# Patient Record
Sex: Female | Born: 1955
Health system: Southern US, Community
[De-identification: ages and names within clinical notes are randomized; demographics above are authoritative.]

## PROBLEM LIST (undated history)

## (undated) DIAGNOSIS — R319 Hematuria, unspecified: Secondary | ICD-10-CM

## (undated) DIAGNOSIS — N841 Polyp of cervix uteri: Secondary | ICD-10-CM

## (undated) DIAGNOSIS — E7800 Pure hypercholesterolemia, unspecified: Secondary | ICD-10-CM

## (undated) DIAGNOSIS — A6009 Herpesviral infection of other urogenital tract: Secondary | ICD-10-CM

## (undated) HISTORY — PX: BACK SURGERY: SHX140

## (undated) HISTORY — PX: LIPOSUCTION TRUNK: SUR833

## (undated) HISTORY — PX: TUBAL LIGATION: SHX77

## (undated) HISTORY — DX: Hematuria, unspecified: R31.9

## (undated) HISTORY — DX: Pure hypercholesterolemia, unspecified: E78.00

## (undated) HISTORY — DX: Polyp of cervix uteri: N84.1

## (undated) HISTORY — DX: Herpesviral infection of other urogenital tract: A60.09

## (undated) HISTORY — PX: CHOLECYSTECTOMY: SHX55

---

## 2001-05-07 ENCOUNTER — Encounter: Payer: Self-pay | Admitting: Internal Medicine

## 2001-05-07 ENCOUNTER — Ambulatory Visit (HOSPITAL_COMMUNITY): Admission: RE | Admit: 2001-05-07 | Discharge: 2001-05-07 | Payer: Self-pay | Admitting: Internal Medicine

## 2001-10-20 ENCOUNTER — Other Ambulatory Visit: Admission: RE | Admit: 2001-10-20 | Discharge: 2001-10-20 | Payer: Self-pay | Admitting: Obstetrics and Gynecology

## 2002-05-11 ENCOUNTER — Encounter: Payer: Self-pay | Admitting: Obstetrics and Gynecology

## 2002-05-11 ENCOUNTER — Ambulatory Visit (HOSPITAL_COMMUNITY): Admission: RE | Admit: 2002-05-11 | Discharge: 2002-05-11 | Payer: Self-pay | Admitting: Obstetrics and Gynecology

## 2002-08-27 ENCOUNTER — Encounter: Payer: Self-pay | Admitting: Podiatry

## 2002-08-30 ENCOUNTER — Ambulatory Visit (HOSPITAL_COMMUNITY): Admission: RE | Admit: 2002-08-30 | Discharge: 2002-08-30 | Payer: Self-pay | Admitting: Podiatry

## 2002-11-01 ENCOUNTER — Other Ambulatory Visit: Admission: RE | Admit: 2002-11-01 | Discharge: 2002-11-01 | Payer: Self-pay | Admitting: Obstetrics and Gynecology

## 2003-05-26 ENCOUNTER — Ambulatory Visit (HOSPITAL_COMMUNITY): Admission: RE | Admit: 2003-05-26 | Discharge: 2003-05-26 | Payer: Self-pay | Admitting: Obstetrics and Gynecology

## 2004-05-01 ENCOUNTER — Ambulatory Visit (HOSPITAL_COMMUNITY): Admission: RE | Admit: 2004-05-01 | Discharge: 2004-05-01 | Payer: Self-pay | Admitting: Orthopedic Surgery

## 2004-05-03 ENCOUNTER — Encounter: Admission: RE | Admit: 2004-05-03 | Discharge: 2004-05-03 | Payer: Self-pay | Admitting: Neurosurgery

## 2004-05-17 ENCOUNTER — Ambulatory Visit (HOSPITAL_COMMUNITY): Admission: RE | Admit: 2004-05-17 | Discharge: 2004-05-18 | Payer: Self-pay | Admitting: Neurosurgery

## 2004-07-09 ENCOUNTER — Ambulatory Visit (HOSPITAL_COMMUNITY): Admission: RE | Admit: 2004-07-09 | Discharge: 2004-07-09 | Payer: Self-pay | Admitting: Obstetrics and Gynecology

## 2004-07-19 ENCOUNTER — Encounter (HOSPITAL_COMMUNITY): Admission: RE | Admit: 2004-07-19 | Discharge: 2004-08-18 | Payer: Self-pay | Admitting: Neurosurgery

## 2005-09-30 ENCOUNTER — Ambulatory Visit (HOSPITAL_COMMUNITY): Admission: RE | Admit: 2005-09-30 | Discharge: 2005-09-30 | Payer: Self-pay | Admitting: Obstetrics and Gynecology

## 2006-01-28 ENCOUNTER — Ambulatory Visit (HOSPITAL_COMMUNITY): Admission: RE | Admit: 2006-01-28 | Discharge: 2006-01-28 | Payer: Self-pay | Admitting: General Surgery

## 2006-09-29 ENCOUNTER — Ambulatory Visit: Payer: Self-pay | Admitting: Orthopedic Surgery

## 2007-05-11 ENCOUNTER — Ambulatory Visit (HOSPITAL_COMMUNITY): Admission: RE | Admit: 2007-05-11 | Discharge: 2007-05-11 | Payer: Self-pay | Admitting: Obstetrics and Gynecology

## 2007-06-16 ENCOUNTER — Emergency Department (HOSPITAL_COMMUNITY): Admission: EM | Admit: 2007-06-16 | Discharge: 2007-06-16 | Payer: Self-pay | Admitting: Emergency Medicine

## 2008-03-23 ENCOUNTER — Other Ambulatory Visit: Admission: RE | Admit: 2008-03-23 | Discharge: 2008-03-23 | Payer: Self-pay | Admitting: Obstetrics and Gynecology

## 2008-05-18 ENCOUNTER — Ambulatory Visit (HOSPITAL_COMMUNITY): Admission: RE | Admit: 2008-05-18 | Discharge: 2008-05-18 | Payer: Self-pay | Admitting: Obstetrics and Gynecology

## 2009-03-28 ENCOUNTER — Other Ambulatory Visit: Admission: RE | Admit: 2009-03-28 | Discharge: 2009-03-28 | Payer: Self-pay | Admitting: Obstetrics and Gynecology

## 2009-05-24 ENCOUNTER — Ambulatory Visit (HOSPITAL_COMMUNITY): Admission: RE | Admit: 2009-05-24 | Discharge: 2009-05-24 | Payer: Self-pay | Admitting: Family Medicine

## 2010-05-25 ENCOUNTER — Ambulatory Visit (HOSPITAL_COMMUNITY): Admission: RE | Admit: 2010-05-25 | Discharge: 2010-05-25 | Payer: Self-pay | Admitting: Obstetrics and Gynecology

## 2010-05-28 ENCOUNTER — Other Ambulatory Visit: Admission: RE | Admit: 2010-05-28 | Discharge: 2010-05-28 | Payer: Self-pay | Admitting: Obstetrics and Gynecology

## 2010-08-18 ENCOUNTER — Encounter: Payer: Self-pay | Admitting: Orthopedic Surgery

## 2010-12-14 NOTE — H&P (Signed)
   NAME:  Dawn Gregory, ADOLF                          ACCOUNT NO.:  [id number]   MEDICAL RECORD NO.:  [id number]                   PATIENT TYPE:  AMB   LOCATION:  DAY                                  FACILITY:  [facility]   PHYSICIAN:  Oley Balm. Pricilla Holm, D.P.M.             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  The patient relates that she has problems  wearing enclosed shoes and difficulty ambulating.  Injection therapy and  anti-inflammatory medication has been attempted without relief of  symptomatology.  The patient requests surgical correction of same.   PAST MEDICAL HISTORY:  1. Cholecystectomy.  2. Tubal ligation.  3. No transfusions or hepatitis.   MEDICATIONS:  1. Lipitor.  2. Iron.   ALLERGIES:  HYDROCODONE.   REVIEW OF SYSTEMS:  Uneventful.   PHYSICAL EXAMINATION:  EXTREMITIES:  Lower extremity reveals palpable pedal  pulses of DP and PT with spontaneous capillary drawn time.  NEUROLOGIC:  Within normal limits.  MUSCULOSKELETAL:  Reveals pain to palpation of the medial limits over the  right first metatarsal, both medially and dorsally.  There is also  limitation of motion of the first MTP of the right foot.   LABORATORY DATA:  X-rays reveal square metatarsal head with noted dorsal  hypertrophy and dorsal spurring that is consistent with hallux limitus.   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    PLAN:  I reviewed both conservative and surgical management.  The patient  elected to have the problem surgically corrected.  I have reviewed with her  decompression osteotomy of the first MTP.  I have reviewed the procedure  with the patient including complications of the procedure, such as  infection,  bone infection, postoperative pain, swelling, etc.  The patient  seems to understand the same and surgery has been scheduled for August 30, 2002.                                                     Oley Balm Pricilla Holm, D.P.M.    DBT/MEDQ  D:  08/29/2002  T:  08/29/2002  Job:  [id number]

## 2010-12-14 NOTE — Op Note (Signed)
Dawn Gregory, Dawn Gregory NO.:  [id number]   MEDICAL RECORD NO.:  [id number]          PATIENT TYPE:  OIB   LOCATION:  2899                         FACILITY:  [facility]   PHYSICIAN:  Clydene Fake, M.D.  DATE OF BIRTH:  Oct 22, 1955   DATE OF PROCEDURE:  05/17/2004  DATE OF DISCHARGE:                                 OPERATIVE REPORT   PREOPERATIVELY DIAGNOSIS:  Herniated nucleus pulposus, left L4-5.   POSTOPERATIVE DIAGNOSIS:  Herniated nucleus pulposus, left L4-5.   PROCEDURES:  Left L4-5 semihemilaminectomy and diskectomy, microdissection  with the microscope.   SURGEON:  Clydene Fake, M.D.   ASSISTANT:  Dr. Lovell Sheehan.   ANESTHESIA:  General endotracheal tube.   ESTIMATED BLOOD LOSS:  Minimal.   BLOOD GIVEN:  None.   DRAINS:  None.   COMPLICATIONS:  None.   REASON FOR PROCEDURE:  The patient is a 55 year old woman with back and left  leg pain that has continued despite prednisone and epidural injection.  MRI  was done showing large central disk protrusion extending cephalad from the  L4-5 level.  The patient was brought in for decompression and diskectomy.   PROCEDURE IN DETAIL:  The patient was brought into the operating room.  General anesthesia was induced.  The patient was placed in the prone  position on the Wilson frame with all pressure points padded.  The patient  was prepped and draped in the usual sterile fashion.  The incision was  injected with 10 mL of 1% lidocaine with epinephrine.  The needle was placed  in the interspace.  X-rays were obtained showing the point at the 3-4  interspace.  The incision was then made centered below.  Replaced the  needle.  Incision taken down to the fascia.  Hemostasis obtained with Bovie  cauterization.  The fascia was incised and subperiosteal dissection to the  L4-5 spinous process and lamina out to the facet.  A protector was placed.  A marker was placed in the interspace.  Another x-ray was obtained  confirming we were at the 4-5 interspace.  The microscope was brought in for  microdissection.  A high-speed drill was used to start a semihemilainectomy  and medial facetectomy.  This was completed with Kerrison punches.  The  ligamentum flavum was removed and foraminotomy was done over the 5 root.  We  explored the disk space and used a root retractor.  We used a hook and found  a free fragment of disk.  We loosened that up and removed a very long free  fragment of disk that was extending cephalad.  This still contained a disk  bulge causing semicompression there and the disk space was incised with a 15  blade and diskectomy performed with pituitary rongeurs and curets.  When it  appeared we had good central and lateral decompression of the central canal  of the 5 and 4 roots, we went up under the dura with a hook looking for any  other free fragments and could not find any.  It was felt we had a good  decompression.  The  wound was irrigated with antibiotic solution.  Hemostasis was obtained with cauterization, Gelfoam and thrombin.  Gelfoam  was irrigated out.  The retractor was removed.  The fascia was closed with 0  Vicryl interrupted suture.  The subcutaneous tissues were closed with 2-0  Vicryl and 3-0 Vicryl interrupted sutures.  The skin was closed with Benzoin  and Steri-Strips.  A dressing was placed.  The patient was placed back into  the supine position, awoke from anesthesia and transferred to the recovery  room in stable condition.       JRH/MEDQ  D:  05/17/2004  T:  05/17/2004  Job:  [id number]

## 2010-12-14 NOTE — Op Note (Signed)
NAME:  Dawn Gregory, Dawn Gregory                          ACCOUNT NO.:  [id number]   MEDICAL RECORD NO.:  [id number]                   PATIENT TYPE:  AMB   LOCATION:  DAY                                  FACILITY:  [facility]   PHYSICIAN:  Oley Balm. Pricilla Holm, D.P.M.             DATE OF BIRTH:  [birth year]   DATE OF PROCEDURE:  08/30/2002  DATE OF DISCHARGE:                                 OPERATIVE REPORT   SURGEON OF RECORD:  Oley Balm. Pricilla Holm, D.P.M.   ANESTHESIA:  Local standby.   PREOPERATIVE DIAGNOSIS:  Hallux limitus rigidus, right foot.   POSTOPERATIVE DIAGNOSIS:  Hallux limitus rigidus, right foot.   PROCEDURE:  Repair of hallux limitus deformity by Eliberto Ivory osteotomy and  shortening osteotomy first metatarsal, right foot.   INDICATIONS FOR SURGERY:  Longstanding history of pain, unrelieved by  conservative care.   DESCRIPTION OF PROCEDURE:  The patient brought in the operating room and  placed on the operating table in the supine position.  The patient's lower  right foot and leg was then prepped and draped in the usual aseptic manner.  Then with ankle tourniquet in place and well-padded to prevent contusion,  elevated 250 mmHg.  After exsanguination of the right foot, the following  surgical procedures were then performed under monitored anesthesia care with  local infiltrate of 2% Xylocaine and 0.25% Marcaine.   AUSTIN OSTEOTOMY FIRST METATARSAL AND SHORTENING OSTEOTOMY FOR REPAIR OF  HALLUS LIMITUS RIGIDUS DEFORMITY:  Attention directed to the dorsomedial  aspect of the first metatarsal where a linear incision made and the incision  widened and deepened via sharp and blunt dissection to be sure to identify  and protect all vital structures.  A capsular incision was then made in the  head of the metatarsal, freeing the transcutaneous soft tissue attachments  dorsally and medially.  It should be noted that osteophyte formation was on  the dorsal aspect of the joint on the proximal phalanx  and laterally and  medially.  All osteophytes were resected.  The medial aspect resected and  then an Austin-type osteotomy was made from medial to lateral.  A 2 mm wedge  of bone was then removed to shorten the metatarsal.  The metatarsal was then  compacted.  It should be noted that the metatarsal was plantar-flexed at the  same time.  The wound was lavaged with copious amounts of sterile saline and  all rough edges rasped smooth, the remaining protruding aspect of the first  metatarsal resected, and the head was then fixated with two .045 K-wires.  After fixation, it was noted that the osteotomy site was stable, all rough  edges again were rasped smooth, the wound lavaged with copious amounts of  sterile saline and the capsule and subcutaneous tissues approximated with  tensioned suture of 4-0 Dexon, and the skin was approximated utilizing  running subcuticular suture of 4-0 Dexon.  All surgical sites  were then infiltrated with approximately 18 mL of  Dexamethasone and phosphate and mild compressive bandages consisting of  Betadine-soaked Adaptic, sterile 4 x 4's, sterile cream were applied.  The  patient tolerated the procedure well and left the operating room in apparent  good condition to recovery room.                                               Oley Balm Pricilla Holm, D.P.M.    DBT/MEDQ  D:  08/30/2002  T:  08/30/2002  Job:  [id number]

## 2010-12-14 NOTE — H&P (Signed)
NAME:  Dawn Gregory, KRAGE                ACCOUNT NO.:  [id number]   MEDICAL RECORD NO.:  [id number]          PATIENT TYPE:  AMB   LOCATION:                                FACILITY:  [facility]   PHYSICIAN:  Dalia Heading, M.D.  DATE OF BIRTH:  June 26, 1956   DATE OF ADMISSION:  DATE OF DISCHARGE:  LH                                HISTORY & PHYSICAL   CHIEF COMPLAINT:  Need for screening colonoscopy.   HISTORY OF PRESENT ILLNESS:  The patient is a 55 year old white female who  presents for screening colonoscopy.  She denies any gastrointestinal  complaints.  There is no family history of colon carcinoma.   PAST MEDICAL HISTORY:  Unremarkable.   PAST SURGICAL HISTORY:  1.  Tubal ligation.  2.  Laparoscopic cholecystectomy.  3.  Herniated disk.   MEDICATIONS:  Vytorin.   ALLERGIES:  HYDROCODONE.   REVIEW OF SYSTEMS:  Noncontributory.   PHYSICAL EXAMINATION:  GENERAL:  The patient is a well-developed, well-  nourished white female in no acute distress.  LUNGS:  Clear to auscultation with equal breath sounds bilaterally.  HEART EXAMINATION:  Reveals A regular rate and rhythm without S3-S4 or  murmurs.  ABDOMEN:  Soft, nontender, nondistended.  No hepatosplenomegaly or masses  are noted.  RECTAL EXAMINATION:  Deferred to the procedure.   IMPRESSION:  Need for screening colonoscopy.   PLAN:  The patient is scheduled for colonoscopy on 01/28/2006.  The risks  and benefits of the procedure including bleeding and perforation were fully  explained to the patient, who gave informed consent.     Dalia Heading, M.D.  Electronically Signed    MAJ/MEDQ  D:  01/27/2006  T:  01/27/2006  Job:  [id number]

## 2011-05-02 ENCOUNTER — Other Ambulatory Visit: Payer: Self-pay | Admitting: Obstetrics & Gynecology

## 2011-05-02 DIAGNOSIS — Z139 Encounter for screening, unspecified: Secondary | ICD-10-CM

## 2011-05-27 ENCOUNTER — Ambulatory Visit (HOSPITAL_COMMUNITY)
Admission: RE | Admit: 2011-05-27 | Discharge: 2011-05-27 | Disposition: A | Discharge status: Home / Self Care | Payer: 59 | Source: Ambulatory Visit | Attending: Obstetrics & Gynecology | Admitting: Obstetrics & Gynecology

## 2011-05-27 DIAGNOSIS — Z139 Encounter for screening, unspecified: Secondary | ICD-10-CM

## 2011-05-27 DIAGNOSIS — Z1231 Encounter for screening mammogram for malignant neoplasm of breast: Secondary | ICD-10-CM | POA: Insufficient documentation

## 2011-06-11 ENCOUNTER — Other Ambulatory Visit: Payer: Self-pay | Admitting: Obstetrics & Gynecology

## 2011-06-11 ENCOUNTER — Other Ambulatory Visit (HOSPITAL_COMMUNITY)
Admission: RE | Admit: 2011-06-11 | Discharge: 2011-06-11 | Disposition: A | Discharge status: Home / Self Care | Payer: 59 | Source: Ambulatory Visit | Attending: Obstetrics & Gynecology | Admitting: Obstetrics & Gynecology

## 2011-06-11 DIAGNOSIS — Z01419 Encounter for gynecological examination (general) (routine) without abnormal findings: Secondary | ICD-10-CM | POA: Insufficient documentation

## 2012-05-01 ENCOUNTER — Other Ambulatory Visit: Payer: Self-pay | Admitting: Obstetrics & Gynecology

## 2012-05-01 DIAGNOSIS — Z139 Encounter for screening, unspecified: Secondary | ICD-10-CM

## 2012-06-01 ENCOUNTER — Ambulatory Visit (HOSPITAL_COMMUNITY)
Admission: RE | Admit: 2012-06-01 | Discharge: 2012-06-01 | Disposition: A | Discharge status: Home / Self Care | Payer: 59 | Source: Ambulatory Visit | Attending: Obstetrics & Gynecology | Admitting: Obstetrics & Gynecology

## 2012-06-01 DIAGNOSIS — Z1231 Encounter for screening mammogram for malignant neoplasm of breast: Secondary | ICD-10-CM | POA: Insufficient documentation

## 2012-06-01 DIAGNOSIS — Z139 Encounter for screening, unspecified: Secondary | ICD-10-CM

## 2012-07-11 ENCOUNTER — Encounter: Payer: Self-pay | Admitting: Advanced Practice Midwife

## 2012-12-28 ENCOUNTER — Other Ambulatory Visit: Payer: Self-pay | Admitting: Obstetrics & Gynecology

## 2013-05-03 ENCOUNTER — Other Ambulatory Visit: Payer: Self-pay | Admitting: Obstetrics & Gynecology

## 2013-10-27 ENCOUNTER — Other Ambulatory Visit: Payer: Self-pay | Admitting: Obstetrics & Gynecology

## 2013-10-27 MED ORDER — ALPRAZOLAM 0.5 MG PO TABS: 0.5000 mg | ORAL_TABLET | Freq: Three times a day (TID) | ORAL | Status: DC | PRN

## 2013-11-23 ENCOUNTER — Ambulatory Visit (HOSPITAL_COMMUNITY)
Admission: RE | Admit: 2013-11-23 | Discharge: 2013-11-23 | Disposition: A | Discharge status: Home / Self Care | Payer: 59 | Source: Ambulatory Visit | Attending: Obstetrics & Gynecology | Admitting: Obstetrics & Gynecology

## 2013-11-23 ENCOUNTER — Other Ambulatory Visit: Payer: Self-pay | Admitting: Obstetrics & Gynecology

## 2013-11-23 DIAGNOSIS — Z1231 Encounter for screening mammogram for malignant neoplasm of breast: Secondary | ICD-10-CM | POA: Insufficient documentation

## 2013-11-23 DIAGNOSIS — Z139 Encounter for screening, unspecified: Secondary | ICD-10-CM

## 2014-04-08 ENCOUNTER — Other Ambulatory Visit: Payer: Self-pay | Admitting: Obstetrics & Gynecology

## 2014-04-13 ENCOUNTER — Other Ambulatory Visit: Payer: Self-pay | Admitting: Obstetrics & Gynecology

## 2014-04-13 MED ORDER — ALPRAZOLAM 0.5 MG PO TABS: 0.5000 mg | ORAL_TABLET | Freq: Three times a day (TID) | ORAL | Status: AC | PRN

## 2014-04-13 MED ORDER — ALPRAZOLAM 0.5 MG PO TABS: 0.5000 mg | ORAL_TABLET | Freq: Three times a day (TID) | ORAL | Status: DC | PRN

## 2014-05-05 ENCOUNTER — Other Ambulatory Visit: Payer: Self-pay | Admitting: Obstetrics & Gynecology

## 2014-05-30 ENCOUNTER — Encounter: Payer: Self-pay | Admitting: Advanced Practice Midwife

## 2015-03-16 ENCOUNTER — Other Ambulatory Visit: Payer: Self-pay | Admitting: Obstetrics & Gynecology

## 2015-03-16 ENCOUNTER — Ambulatory Visit (HOSPITAL_COMMUNITY)
Admission: RE | Admit: 2015-03-16 | Discharge: 2015-03-16 | Disposition: A | Discharge status: Home / Self Care | Payer: 59 | Source: Ambulatory Visit | Attending: Obstetrics & Gynecology | Admitting: Obstetrics & Gynecology

## 2015-03-16 DIAGNOSIS — Z1231 Encounter for screening mammogram for malignant neoplasm of breast: Secondary | ICD-10-CM | POA: Diagnosis not present

## 2015-05-22 ENCOUNTER — Other Ambulatory Visit: Payer: Self-pay | Admitting: Obstetrics & Gynecology

## 2015-08-21 MED FILL — SIMVASTATIN 20 MG TABLET: 20 | 90 days supply | Qty: 90 | Fill #1

## 2015-10-03 DIAGNOSIS — E785 Hyperlipidemia, unspecified: Secondary | ICD-10-CM | POA: Diagnosis not present

## 2015-10-03 DIAGNOSIS — Z0000 Encounter for general adult medical examination without abnormal findings: Secondary | ICD-10-CM | POA: Diagnosis not present

## 2015-10-06 DIAGNOSIS — Z6834 Body mass index (BMI) 34.0-34.9, adult: Secondary | ICD-10-CM | POA: Diagnosis not present

## 2015-10-06 DIAGNOSIS — E782 Mixed hyperlipidemia: Secondary | ICD-10-CM | POA: Diagnosis not present

## 2015-10-06 DIAGNOSIS — E6609 Other obesity due to excess calories: Secondary | ICD-10-CM | POA: Diagnosis not present

## 2015-10-06 DIAGNOSIS — F411 Generalized anxiety disorder: Secondary | ICD-10-CM | POA: Diagnosis not present

## 2015-10-09 MED FILL — ALPRAZolam 0.5 MG TABS: 0.5 | 20 days supply | Qty: 60 | Fill #0

## 2015-10-09 MED FILL — PHENTERMINE 15 MG CAPSULE: 15 | 30 days supply | Qty: 30 | Fill #0

## 2015-11-01 DIAGNOSIS — Z6834 Body mass index (BMI) 34.0-34.9, adult: Secondary | ICD-10-CM | POA: Diagnosis not present

## 2015-11-01 DIAGNOSIS — E6609 Other obesity due to excess calories: Secondary | ICD-10-CM | POA: Diagnosis not present

## 2015-11-03 MED FILL — PHENTERMINE 30 MG CAPSULE: 30 | 30 days supply | Qty: 30 | Fill #0

## 2015-11-20 MED FILL — SIMVASTATIN 20 MG TABLET: 20 | 90 days supply | Qty: 90 | Fill #2

## 2015-12-06 DIAGNOSIS — E782 Mixed hyperlipidemia: Secondary | ICD-10-CM | POA: Diagnosis not present

## 2015-12-06 MED FILL — PHENTERMINE 37.5 MG TABLET: 37.5 | 30 days supply | Qty: 30 | Fill #0

## 2016-01-05 MED FILL — PHENTERMINE 37.5 MG TABLET: 37.5 | 30 days supply | Qty: 30 | Fill #1

## 2016-02-06 MED FILL — PHENTERMINE 37.5 MG TABLET: 37.5 | 30 days supply | Qty: 30 | Fill #2

## 2016-02-19 MED FILL — SIMVASTATIN 20 MG TABLET: 20 | 90 days supply | Qty: 90 | Fill #3

## 2016-03-12 DIAGNOSIS — E782 Mixed hyperlipidemia: Secondary | ICD-10-CM | POA: Diagnosis not present

## 2016-03-12 DIAGNOSIS — E6609 Other obesity due to excess calories: Secondary | ICD-10-CM | POA: Diagnosis not present

## 2016-03-13 MED FILL — ALPRAZolam 0.5 MG TABS: 0.5 | 20 days supply | Qty: 60 | Fill #0

## 2016-03-13 MED FILL — PHENTERMINE 37.5 MG TABLET: 37.5 | 30 days supply | Qty: 30 | Fill #0

## 2016-05-06 MED FILL — PHENTERMINE 37.5 MG TABLET: 37.5 | 30 days supply | Qty: 30 | Fill #1

## 2016-05-10 ENCOUNTER — Other Ambulatory Visit (HOSPITAL_COMMUNITY): Payer: Self-pay | Admitting: Adult Health Nurse Practitioner

## 2016-05-10 ENCOUNTER — Ambulatory Visit (HOSPITAL_COMMUNITY)
Admission: RE | Admit: 2016-05-10 | Discharge: 2016-05-10 | Disposition: A | Discharge status: Home / Self Care | Payer: 59 | Source: Ambulatory Visit | Attending: Adult Health Nurse Practitioner | Admitting: Adult Health Nurse Practitioner

## 2016-05-10 DIAGNOSIS — Z1231 Encounter for screening mammogram for malignant neoplasm of breast: Secondary | ICD-10-CM | POA: Insufficient documentation

## 2016-05-14 MED FILL — SIMVASTATIN 20 MG TABLET: 20 | 90 days supply | Qty: 90 | Fill #0

## 2016-07-12 MED FILL — PHENTERMINE 37.5 MG TABLET: 37.5 | 30 days supply | Qty: 30 | Fill #2

## 2016-08-19 MED FILL — SIMVASTATIN 20 MG TABLET: 20 | 90 days supply | Qty: 90 | Fill #1

## 2016-10-25 DIAGNOSIS — H43813 Vitreous degeneration, bilateral: Secondary | ICD-10-CM | POA: Diagnosis not present

## 2016-10-25 DIAGNOSIS — H52221 Regular astigmatism, right eye: Secondary | ICD-10-CM | POA: Diagnosis not present

## 2016-10-25 DIAGNOSIS — H5213 Myopia, bilateral: Secondary | ICD-10-CM | POA: Diagnosis not present

## 2016-10-25 DIAGNOSIS — H524 Presbyopia: Secondary | ICD-10-CM | POA: Diagnosis not present

## 2016-11-12 DIAGNOSIS — E782 Mixed hyperlipidemia: Secondary | ICD-10-CM | POA: Diagnosis not present

## 2016-11-15 DIAGNOSIS — E782 Mixed hyperlipidemia: Secondary | ICD-10-CM | POA: Diagnosis not present

## 2016-11-15 DIAGNOSIS — Z6831 Body mass index (BMI) 31.0-31.9, adult: Secondary | ICD-10-CM | POA: Diagnosis not present

## 2016-11-15 DIAGNOSIS — E6609 Other obesity due to excess calories: Secondary | ICD-10-CM | POA: Diagnosis not present

## 2016-11-15 DIAGNOSIS — Z0000 Encounter for general adult medical examination without abnormal findings: Secondary | ICD-10-CM | POA: Diagnosis not present

## 2016-11-15 DIAGNOSIS — F411 Generalized anxiety disorder: Secondary | ICD-10-CM | POA: Diagnosis not present

## 2016-11-15 MED FILL — SIMVASTATIN 20 MG TABLET: 20 | 90 days supply | Qty: 90 | Fill #0

## 2016-11-18 MED FILL — PHENTERMINE 37.5 MG TABLET: 37.5 | 30 days supply | Qty: 30 | Fill #0

## 2017-02-17 MED FILL — SIMVASTATIN 20 MG TABLET: 20 | 90 days supply | Qty: 90 | Fill #1

## 2017-05-22 ENCOUNTER — Ambulatory Visit (HOSPITAL_COMMUNITY)
Admission: RE | Admit: 2017-05-22 | Discharge: 2017-05-22 | Disposition: A | Discharge status: Home / Self Care | Payer: 59 | Source: Ambulatory Visit | Attending: Internal Medicine | Admitting: Internal Medicine

## 2017-05-22 ENCOUNTER — Other Ambulatory Visit (HOSPITAL_COMMUNITY): Payer: Self-pay | Admitting: Internal Medicine

## 2017-05-22 DIAGNOSIS — Z1231 Encounter for screening mammogram for malignant neoplasm of breast: Secondary | ICD-10-CM | POA: Diagnosis not present

## 2017-06-03 MED FILL — SIMVASTATIN 20 MG TABLET: 20 | 90 days supply | Qty: 90 | Fill #2

## 2017-08-18 DIAGNOSIS — J069 Acute upper respiratory infection, unspecified: Secondary | ICD-10-CM | POA: Diagnosis not present

## 2017-09-17 MED FILL — SIMVASTATIN 20 MG TABLET: 20 | 90 days supply | Qty: 90 | Fill #0

## 2017-11-13 DIAGNOSIS — E6609 Other obesity due to excess calories: Secondary | ICD-10-CM | POA: Diagnosis not present

## 2017-11-13 DIAGNOSIS — E782 Mixed hyperlipidemia: Secondary | ICD-10-CM | POA: Diagnosis not present

## 2017-11-13 DIAGNOSIS — J069 Acute upper respiratory infection, unspecified: Secondary | ICD-10-CM | POA: Diagnosis not present

## 2017-11-17 DIAGNOSIS — E782 Mixed hyperlipidemia: Secondary | ICD-10-CM | POA: Diagnosis not present

## 2017-11-17 DIAGNOSIS — Z01419 Encounter for gynecological examination (general) (routine) without abnormal findings: Secondary | ICD-10-CM | POA: Diagnosis not present

## 2017-11-17 DIAGNOSIS — J069 Acute upper respiratory infection, unspecified: Secondary | ICD-10-CM | POA: Diagnosis not present

## 2017-11-17 DIAGNOSIS — Z6835 Body mass index (BMI) 35.0-35.9, adult: Secondary | ICD-10-CM | POA: Diagnosis not present

## 2017-11-17 DIAGNOSIS — F419 Anxiety disorder, unspecified: Secondary | ICD-10-CM | POA: Diagnosis not present

## 2017-11-17 DIAGNOSIS — Z0001 Encounter for general adult medical examination with abnormal findings: Secondary | ICD-10-CM | POA: Diagnosis not present

## 2017-12-12 MED FILL — SIMVASTATIN 20 MG TABLET: 20 | 90 days supply | Qty: 90 | Fill #1

## 2018-03-16 MED FILL — SIMVASTATIN 20 MG TABLET: 20 | 90 days supply | Qty: 90 | Fill #2

## 2018-06-15 MED FILL — SIMVASTATIN 20 MG TABLET: 20 | 90 days supply | Qty: 90 | Fill #0

## 2018-06-29 ENCOUNTER — Other Ambulatory Visit (HOSPITAL_COMMUNITY): Payer: Self-pay | Admitting: Adult Health Nurse Practitioner

## 2018-06-29 DIAGNOSIS — Z1231 Encounter for screening mammogram for malignant neoplasm of breast: Secondary | ICD-10-CM

## 2018-07-03 ENCOUNTER — Ambulatory Visit (HOSPITAL_COMMUNITY)
Admission: RE | Admit: 2018-07-03 | Discharge: 2018-07-03 | Disposition: A | Discharge status: Home / Self Care | Payer: 59 | Source: Ambulatory Visit | Attending: Adult Health Nurse Practitioner | Admitting: Adult Health Nurse Practitioner

## 2018-07-03 DIAGNOSIS — Z1231 Encounter for screening mammogram for malignant neoplasm of breast: Secondary | ICD-10-CM | POA: Diagnosis not present

## 2018-09-15 MED FILL — SIMVASTATIN 20 MG TABLET: 20 | 90 days supply | Qty: 90 | Fill #1

## 2018-09-28 DIAGNOSIS — H5213 Myopia, bilateral: Secondary | ICD-10-CM | POA: Diagnosis not present

## 2018-09-28 DIAGNOSIS — H524 Presbyopia: Secondary | ICD-10-CM | POA: Diagnosis not present

## 2018-09-28 DIAGNOSIS — H43813 Vitreous degeneration, bilateral: Secondary | ICD-10-CM | POA: Diagnosis not present

## 2018-10-08 DIAGNOSIS — J069 Acute upper respiratory infection, unspecified: Secondary | ICD-10-CM | POA: Diagnosis not present

## 2018-11-11 DIAGNOSIS — E782 Mixed hyperlipidemia: Secondary | ICD-10-CM | POA: Diagnosis not present

## 2018-11-11 DIAGNOSIS — R7301 Impaired fasting glucose: Secondary | ICD-10-CM | POA: Diagnosis not present

## 2018-11-16 DIAGNOSIS — E785 Hyperlipidemia, unspecified: Secondary | ICD-10-CM | POA: Diagnosis not present

## 2018-11-16 DIAGNOSIS — R7301 Impaired fasting glucose: Secondary | ICD-10-CM | POA: Diagnosis not present

## 2018-11-16 DIAGNOSIS — Z0001 Encounter for general adult medical examination with abnormal findings: Secondary | ICD-10-CM | POA: Diagnosis not present

## 2018-11-16 DIAGNOSIS — F419 Anxiety disorder, unspecified: Secondary | ICD-10-CM | POA: Diagnosis not present

## 2018-11-16 MED FILL — ALPRAZolam 0.5 MG TABS: 0.5 | 30 days supply | Qty: 60 | Fill #0

## 2018-12-15 MED FILL — SIMVASTATIN 20 MG TABLET: 20 | 90 days supply | Qty: 90 | Fill #0

## 2019-03-15 MED FILL — SIMVASTATIN 20 MG TABLET: 20 | 90 days supply | Qty: 90 | Fill #1

## 2019-06-09 MED FILL — SIMVASTATIN 20 MG TABLET: 20 | 90 days supply | Qty: 90 | Fill #0

## 2019-06-11 ENCOUNTER — Other Ambulatory Visit (HOSPITAL_COMMUNITY): Payer: Self-pay | Admitting: Adult Health Nurse Practitioner

## 2019-06-11 DIAGNOSIS — Z1231 Encounter for screening mammogram for malignant neoplasm of breast: Secondary | ICD-10-CM

## 2019-07-07 ENCOUNTER — Ambulatory Visit (HOSPITAL_COMMUNITY)
Admission: RE | Admit: 2019-07-07 | Discharge: 2019-07-07 | Disposition: A | Discharge status: Home / Self Care | Payer: 59 | Source: Ambulatory Visit | Attending: Adult Health Nurse Practitioner | Admitting: Adult Health Nurse Practitioner

## 2019-07-07 ENCOUNTER — Other Ambulatory Visit: Payer: Self-pay

## 2019-07-07 DIAGNOSIS — Z1231 Encounter for screening mammogram for malignant neoplasm of breast: Secondary | ICD-10-CM | POA: Insufficient documentation

## 2019-07-28 DIAGNOSIS — J069 Acute upper respiratory infection, unspecified: Secondary | ICD-10-CM | POA: Diagnosis not present

## 2019-07-28 DIAGNOSIS — J029 Acute pharyngitis, unspecified: Secondary | ICD-10-CM | POA: Diagnosis not present

## 2019-09-14 MED FILL — SIMVASTATIN 20 MG TABLET: 20 | 90 days supply | Qty: 90 | Fill #1

## 2019-11-18 DIAGNOSIS — Z0001 Encounter for general adult medical examination with abnormal findings: Secondary | ICD-10-CM | POA: Diagnosis not present

## 2019-11-18 DIAGNOSIS — J029 Acute pharyngitis, unspecified: Secondary | ICD-10-CM | POA: Diagnosis not present

## 2019-11-18 DIAGNOSIS — R7301 Impaired fasting glucose: Secondary | ICD-10-CM | POA: Diagnosis not present

## 2019-11-18 DIAGNOSIS — E782 Mixed hyperlipidemia: Secondary | ICD-10-CM | POA: Diagnosis not present

## 2019-11-18 DIAGNOSIS — Z6835 Body mass index (BMI) 35.0-35.9, adult: Secondary | ICD-10-CM | POA: Diagnosis not present

## 2019-11-18 DIAGNOSIS — J069 Acute upper respiratory infection, unspecified: Secondary | ICD-10-CM | POA: Diagnosis not present

## 2019-11-18 DIAGNOSIS — E785 Hyperlipidemia, unspecified: Secondary | ICD-10-CM | POA: Diagnosis not present

## 2019-11-18 DIAGNOSIS — F419 Anxiety disorder, unspecified: Secondary | ICD-10-CM | POA: Diagnosis not present

## 2019-11-23 DIAGNOSIS — F419 Anxiety disorder, unspecified: Secondary | ICD-10-CM | POA: Diagnosis not present

## 2019-11-23 DIAGNOSIS — Z0001 Encounter for general adult medical examination with abnormal findings: Secondary | ICD-10-CM | POA: Diagnosis not present

## 2019-11-23 DIAGNOSIS — R7301 Impaired fasting glucose: Secondary | ICD-10-CM | POA: Diagnosis not present

## 2019-11-23 DIAGNOSIS — Z6836 Body mass index (BMI) 36.0-36.9, adult: Secondary | ICD-10-CM | POA: Diagnosis not present

## 2019-11-23 DIAGNOSIS — E782 Mixed hyperlipidemia: Secondary | ICD-10-CM | POA: Diagnosis not present

## 2019-11-23 DIAGNOSIS — E6609 Other obesity due to excess calories: Secondary | ICD-10-CM | POA: Diagnosis not present

## 2019-12-22 MED FILL — SIMVASTATIN 20 MG TABLET: 20 | 90 days supply | Qty: 90 | Fill #0

## 2020-08-04 ENCOUNTER — Other Ambulatory Visit (HOSPITAL_COMMUNITY): Payer: Self-pay | Admitting: Internal Medicine

## 2020-08-04 DIAGNOSIS — Z1231 Encounter for screening mammogram for malignant neoplasm of breast: Secondary | ICD-10-CM

## 2020-09-29 ENCOUNTER — Ambulatory Visit (HOSPITAL_COMMUNITY): Payer: 59

## 2020-10-06 ENCOUNTER — Ambulatory Visit (HOSPITAL_COMMUNITY)
Admission: RE | Admit: 2020-10-06 | Discharge: 2020-10-06 | Disposition: A | Discharge status: Home / Self Care | Payer: Medicare Other | Source: Ambulatory Visit | Attending: Internal Medicine | Admitting: Internal Medicine

## 2020-10-06 ENCOUNTER — Other Ambulatory Visit: Payer: Self-pay

## 2020-10-06 DIAGNOSIS — Z1231 Encounter for screening mammogram for malignant neoplasm of breast: Secondary | ICD-10-CM | POA: Diagnosis not present

## 2020-11-28 ENCOUNTER — Other Ambulatory Visit (HOSPITAL_COMMUNITY): Payer: Self-pay | Admitting: Internal Medicine

## 2020-11-28 DIAGNOSIS — Z13820 Encounter for screening for osteoporosis: Secondary | ICD-10-CM

## 2021-04-24 IMAGING — MG DIGITAL SCREENING BILAT W/ TOMO W/ CAD
8 series · 8 of 24 positions shown · non-contrast
Comparison: Previous exam(s).

CLINICAL DATA: Screening.

EXAM:
DIGITAL SCREENING BILATERAL MAMMOGRAM WITH TOMO AND CAD

[R CC synth-2D]
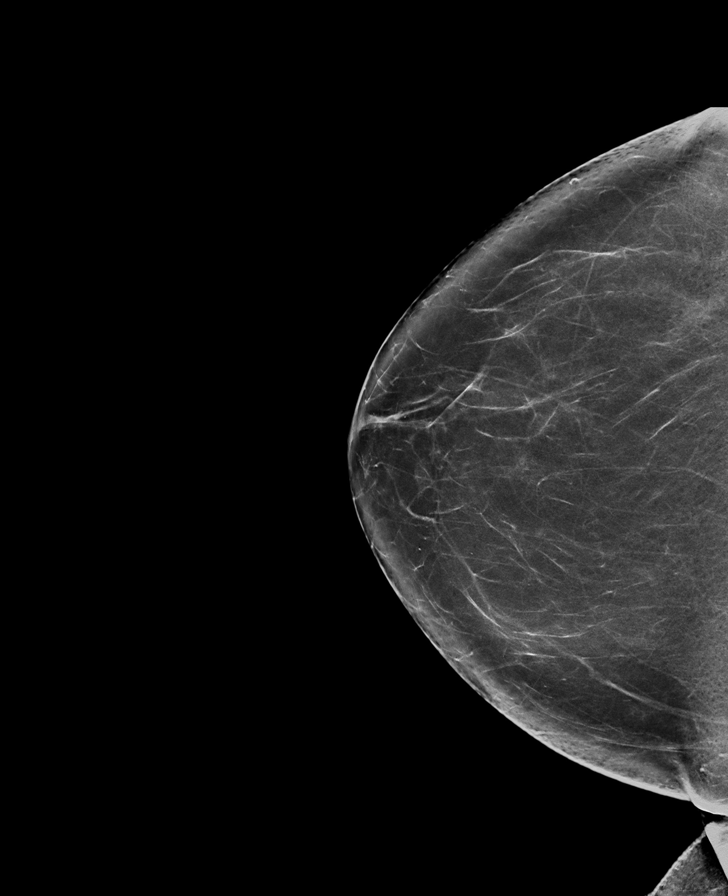

[L CC synth-2D]
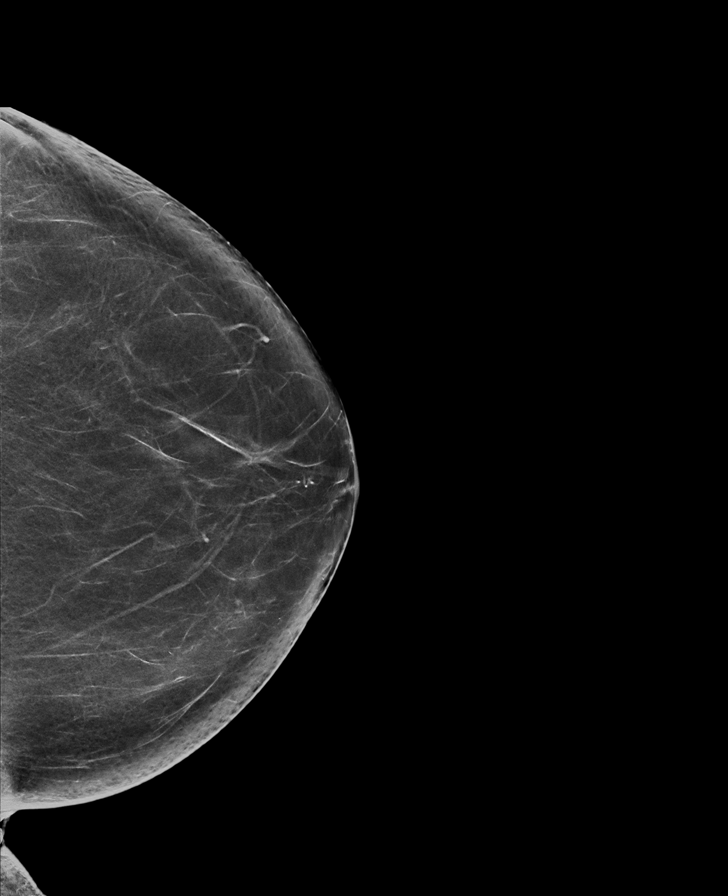

[R MLO synth-2D]
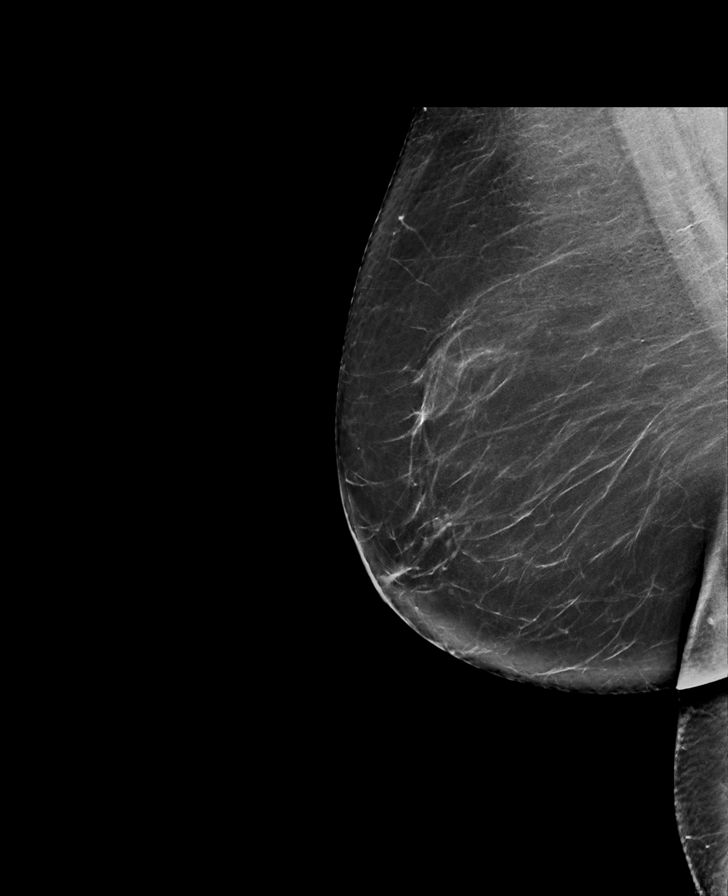

[L MLO synth-2D]
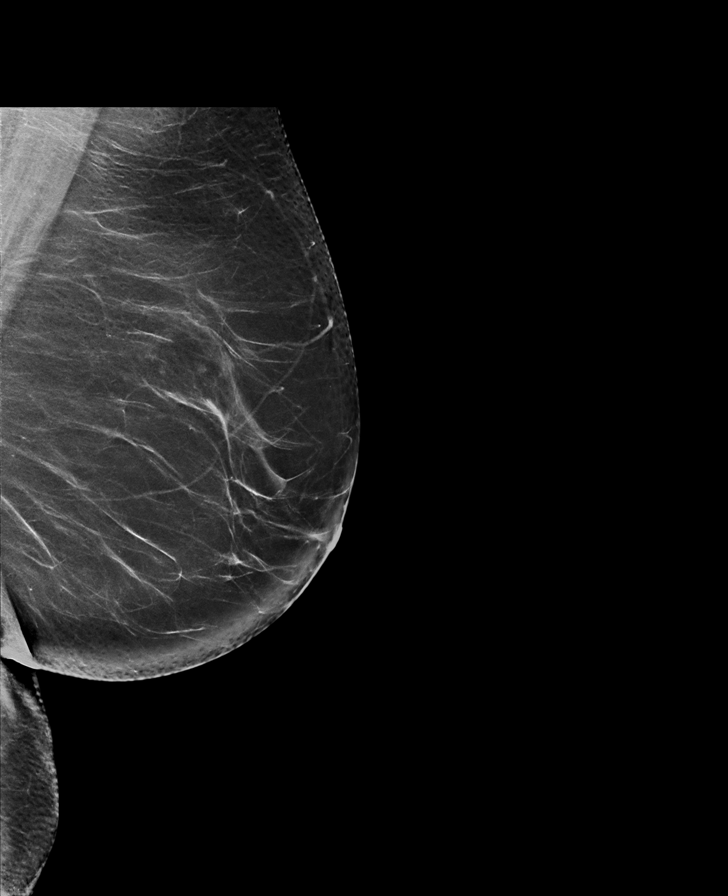

[L CC tomo · tomo slice 39/76.0]
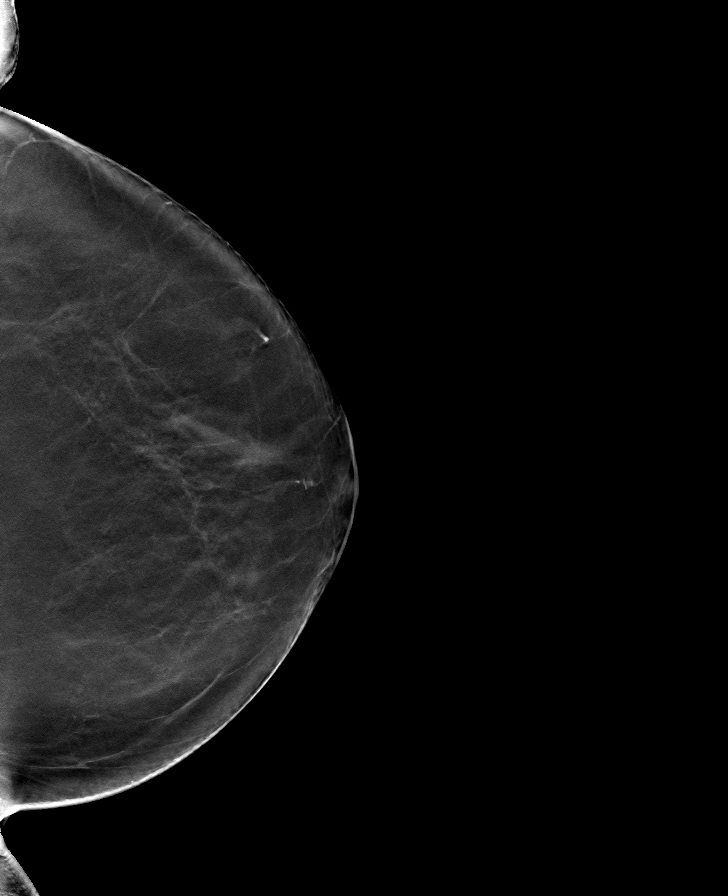

[R MLO tomo · tomo slice 43/84.0]
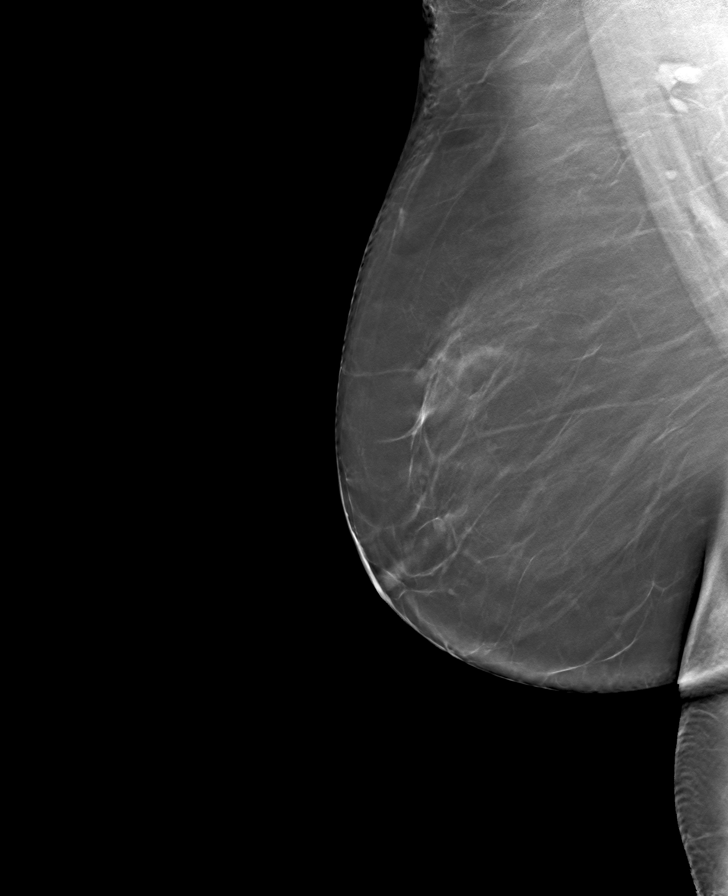

[L MLO tomo · tomo slice 41/82.0]
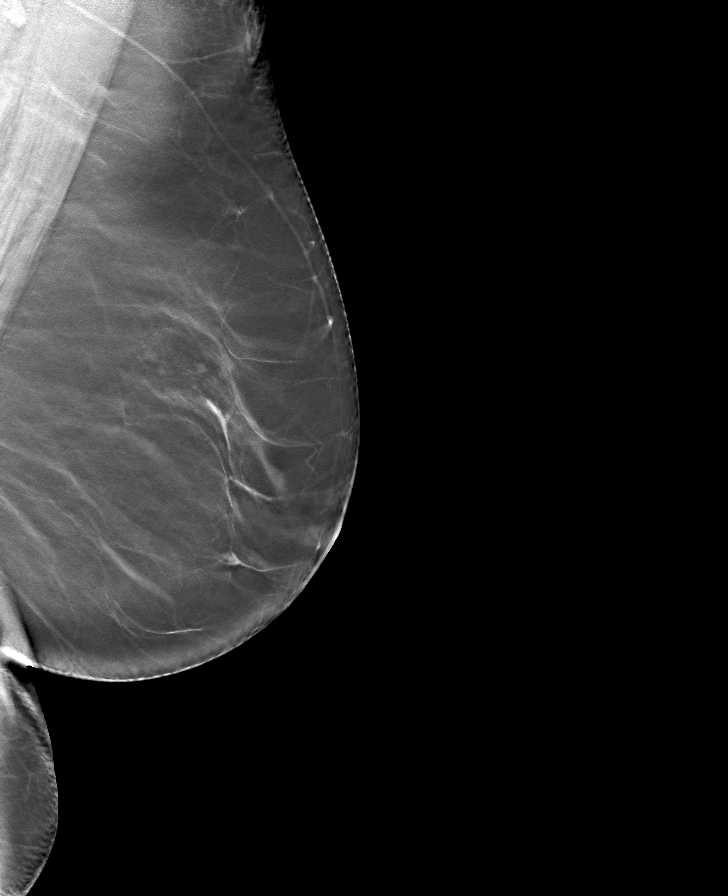

[R CC tomo · tomo slice 39/78.0]
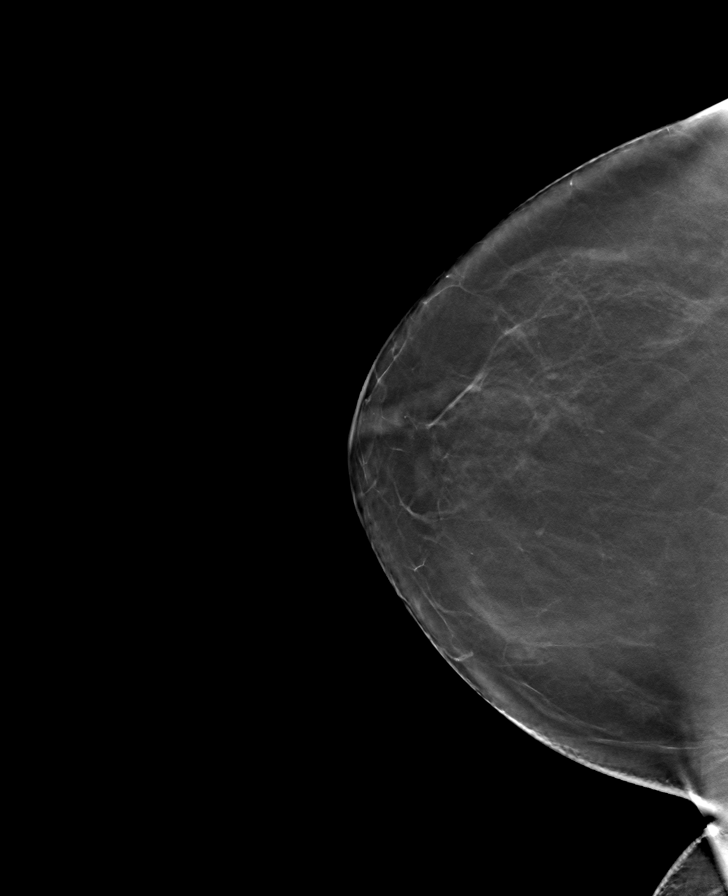

[8 of 24 positions shown; findings below may reference images not displayed]

ACR Breast Density Category b: There are scattered areas of
fibroglandular density.
FINDINGS: There are no findings suspicious for malignancy. Images were
processed with CAD.
IMPRESSION: No mammographic evidence of malignancy. A result letter of this
screening mammogram will be mailed directly to the patient.

RECOMMENDATION:
Screening mammogram in one year. (Code:CN-U-775)

BI-RADS CATEGORY  1: Negative.

## 2021-09-18 ENCOUNTER — Other Ambulatory Visit (HOSPITAL_COMMUNITY): Payer: Self-pay | Admitting: Internal Medicine

## 2021-09-18 DIAGNOSIS — Z1231 Encounter for screening mammogram for malignant neoplasm of breast: Secondary | ICD-10-CM

## 2021-10-10 ENCOUNTER — Ambulatory Visit (HOSPITAL_COMMUNITY)
Admission: RE | Admit: 2021-10-10 | Discharge: 2021-10-10 | Disposition: A | Discharge status: Home / Self Care | Payer: Medicare Other | Source: Ambulatory Visit | Attending: Internal Medicine | Admitting: Internal Medicine

## 2021-10-10 ENCOUNTER — Other Ambulatory Visit: Payer: Self-pay

## 2021-10-10 DIAGNOSIS — Z1231 Encounter for screening mammogram for malignant neoplasm of breast: Secondary | ICD-10-CM | POA: Insufficient documentation

## 2021-10-10 DIAGNOSIS — M810 Age-related osteoporosis without current pathological fracture: Secondary | ICD-10-CM | POA: Diagnosis not present

## 2021-10-10 DIAGNOSIS — Z13820 Encounter for screening for osteoporosis: Secondary | ICD-10-CM | POA: Diagnosis not present

## 2021-10-10 DIAGNOSIS — Z780 Asymptomatic menopausal state: Secondary | ICD-10-CM | POA: Diagnosis not present

## 2021-12-11 ENCOUNTER — Other Ambulatory Visit: Payer: Self-pay

## 2021-12-11 DIAGNOSIS — M810 Age-related osteoporosis without current pathological fracture: Secondary | ICD-10-CM | POA: Insufficient documentation

## 2021-12-14 ENCOUNTER — Telehealth: Payer: Self-pay | Admitting: Pharmacy Technician

## 2021-12-14 NOTE — Telephone Encounter (Addendum)
Auth Submission: NO AUTH NEEDED Payer: MEDICARE A/B BCBS MEDSUP Medication & CPT/J Code(s) submitted: Prolia (Denosumab) [id number] Route of submission (phone, fax, portal): PHONE Auth type: Buy/Bill Units/visits requested: X1 DOSE Reference number:  Approval from: 12/14/21 to 07/29/23  07/25/22 - Medicare part b and supplement eligibility verified and approval extended till 07/29/23

## 2021-12-26 ENCOUNTER — Ambulatory Visit (INDEPENDENT_AMBULATORY_CARE_PROVIDER_SITE_OTHER): Payer: Medicare Other

## 2021-12-26 VITALS — BP 100/64 | HR 66 | Temp 98.7°F | Resp 16 | Ht 61.0 in | Wt 183.6 lb

## 2021-12-26 DIAGNOSIS — M810 Age-related osteoporosis without current pathological fracture: Secondary | ICD-10-CM

## 2021-12-26 MED ORDER — DENOSUMAB 60 MG/ML ~~LOC~~ SOSY
60.0000 mg | PREFILLED_SYRINGE | Freq: Once | SUBCUTANEOUS | Status: AC
  Administered 2021-12-26: 60 mg via SUBCUTANEOUS

## 2021-12-26 NOTE — Progress Notes (Signed)
Diagnosis: Osteoporosis  Provider:  Praveen Mannam, MD  Procedure: Injection  Prolia (Denosumab), Dose: 60 mg, Site: subcutaneous, Number of injections: 1  Discharge: 30 minute observation completed. Condition: Good, Destination: Home . AVS provided to patient.   Performed by:  Luzelena Heeg E Kannen Moxey, LPN       

## 2022-07-01 ENCOUNTER — Ambulatory Visit: Payer: Medicare Other

## 2022-07-01 VITALS — BP 128/75 | HR 68 | Temp 97.6°F | Resp 18 | Ht 61.5 in | Wt 185.2 lb

## 2022-07-01 DIAGNOSIS — M810 Age-related osteoporosis without current pathological fracture: Secondary | ICD-10-CM

## 2022-07-01 MED ORDER — DENOSUMAB 60 MG/ML ~~LOC~~ SOSY
60.0000 mg | PREFILLED_SYRINGE | Freq: Once | SUBCUTANEOUS | Status: AC
  Administered 2022-07-01: 60 mg via SUBCUTANEOUS
  Filled 2022-07-01: qty 1

## 2022-07-01 NOTE — Progress Notes (Signed)
Diagnosis: Osteoporosis  Provider:  Praveen Mannam MD  Procedure: Injection  Prolia (Denosumab), Dose: 60 mg, Site: subcutaneous, Number of injections: 1  Post Care:     Discharge: Condition: Good, Destination: Home . AVS provided to patient.   Performed by:  Neomi Laidler, RN       

## 2022-10-17 ENCOUNTER — Other Ambulatory Visit (HOSPITAL_COMMUNITY): Payer: Self-pay | Admitting: Internal Medicine

## 2022-10-17 DIAGNOSIS — Z1231 Encounter for screening mammogram for malignant neoplasm of breast: Secondary | ICD-10-CM

## 2022-10-30 ENCOUNTER — Encounter: Payer: Self-pay | Admitting: Pulmonary Disease

## 2022-11-06 ENCOUNTER — Encounter: Payer: Self-pay | Admitting: Pulmonary Disease

## 2022-11-06 ENCOUNTER — Ambulatory Visit (HOSPITAL_COMMUNITY)
Admission: RE | Admit: 2022-11-06 | Discharge: 2022-11-06 | Disposition: A | Discharge status: Home / Self Care | Payer: Medicare Other | Source: Ambulatory Visit | Attending: Internal Medicine | Admitting: Internal Medicine

## 2022-11-06 DIAGNOSIS — Z1231 Encounter for screening mammogram for malignant neoplasm of breast: Secondary | ICD-10-CM

## 2023-01-03 ENCOUNTER — Ambulatory Visit (INDEPENDENT_AMBULATORY_CARE_PROVIDER_SITE_OTHER): Payer: Medicare Other

## 2023-01-03 VITALS — BP 137/73 | HR 87 | Temp 98.1°F | Resp 20 | Ht 61.0 in | Wt 194.5 lb

## 2023-01-03 DIAGNOSIS — M810 Age-related osteoporosis without current pathological fracture: Secondary | ICD-10-CM

## 2023-01-03 MED ORDER — DENOSUMAB 60 MG/ML ~~LOC~~ SOSY
60.0000 mg | PREFILLED_SYRINGE | Freq: Once | SUBCUTANEOUS | Status: AC
  Administered 2023-01-03: 60 mg via SUBCUTANEOUS
  Filled 2023-01-03: qty 1

## 2023-01-03 NOTE — Progress Notes (Signed)
Diagnosis: Osteoporosis  Provider:  Chilton Greathouse MD  Procedure: Injection  Prolia (Denosumab), Dose: 60 mg, Site: subcutaneous, Number of injections: 1  Post Care: Patient declined observation  Discharge: Condition: Good, Destination: Home . AVS Provided  Performed by:  Marlow Baars [place], RN

## 2023-07-07 ENCOUNTER — Ambulatory Visit: Payer: Medicare Other

## 2023-07-07 VITALS — BP 128/72 | HR 80 | Temp 98.1°F | Resp 20 | Ht 65.0 in | Wt 184.0 lb

## 2023-07-07 DIAGNOSIS — M810 Age-related osteoporosis without current pathological fracture: Secondary | ICD-10-CM | POA: Diagnosis not present

## 2023-07-07 MED ORDER — DENOSUMAB 60 MG/ML ~~LOC~~ SOSY
60.0000 mg | PREFILLED_SYRINGE | Freq: Once | SUBCUTANEOUS | Status: AC
  Administered 2023-07-07: 60 mg via SUBCUTANEOUS
  Filled 2023-07-07: qty 1

## 2023-07-07 NOTE — Progress Notes (Signed)
Diagnosis: Osteoporosis  Provider:  Chilton Greathouse MD  Procedure: Injection  Prolia (Denosumab), Dose: 60 mg, Site: subcutaneous, Number of injections: 1  Injection Site(s): Left arm  Post Care: Patient declined observation  Discharge: Condition: Good, Destination: Home . AVS Declined  Performed by:  Adriana Mccallum, RN

## 2023-08-18 ENCOUNTER — Telehealth: Payer: Self-pay | Admitting: Pharmacy Technician

## 2023-08-18 NOTE — Telephone Encounter (Signed)
Auth Submission: NO AUTH NEEDED Payer: MEDICARE A/B BCBS MEDSUP Medication & CPT/J Code(s) submitted: Prolia (Denosumab) [id number] Route of submission (phone, fax, portal): PHONE Auth type: Buy/Bill Units/visits requested: X1 DOSE Reference number:  Approval from: 08/18/23 - 08/28/24   Medicare part b and supplement eligibility verified and approval extended till 08/28/24

## 2023-10-06 ENCOUNTER — Other Ambulatory Visit (HOSPITAL_COMMUNITY): Payer: Self-pay | Admitting: Nurse Practitioner

## 2023-10-06 ENCOUNTER — Other Ambulatory Visit (HOSPITAL_COMMUNITY): Payer: Self-pay | Admitting: Internal Medicine

## 2023-10-06 DIAGNOSIS — Z1231 Encounter for screening mammogram for malignant neoplasm of breast: Secondary | ICD-10-CM

## 2023-10-06 DIAGNOSIS — M810 Age-related osteoporosis without current pathological fracture: Secondary | ICD-10-CM

## 2023-11-07 ENCOUNTER — Ambulatory Visit (HOSPITAL_COMMUNITY)
Admission: RE | Admit: 2023-11-07 | Discharge: 2023-11-07 | Disposition: A | Discharge status: Home / Self Care | Source: Ambulatory Visit | Attending: Nurse Practitioner | Admitting: Nurse Practitioner

## 2023-11-07 ENCOUNTER — Encounter (HOSPITAL_COMMUNITY): Payer: Self-pay

## 2023-11-07 ENCOUNTER — Ambulatory Visit (HOSPITAL_COMMUNITY)
Admission: RE | Admit: 2023-11-07 | Discharge: 2023-11-07 | Disposition: A | Discharge status: Home / Self Care | Source: Ambulatory Visit | Attending: Internal Medicine | Admitting: Internal Medicine

## 2023-11-07 DIAGNOSIS — Z1231 Encounter for screening mammogram for malignant neoplasm of breast: Secondary | ICD-10-CM | POA: Insufficient documentation

## 2023-11-07 DIAGNOSIS — M810 Age-related osteoporosis without current pathological fracture: Secondary | ICD-10-CM

## 2023-11-19 ENCOUNTER — Other Ambulatory Visit: Payer: Self-pay | Admitting: Internal Medicine

## 2023-11-19 ENCOUNTER — Telehealth: Payer: Self-pay

## 2023-11-19 NOTE — Telephone Encounter (Signed)
 Auth Submission: NO AUTH NEEDED Site of care: Site of care: [facility] Payer: medicare a/b, bcbs supp Medication & CPT/J Code(s) submitted: Prolia  (Denosumab ) [id number] Route of submission (phone, fax, portal): portal Phone # Fax # Auth type: Buy/Bill HB Units/visits requested: 60mg , q85months Reference number:  Approval from: 11/19/23 to 07/28/24

## 2023-11-25 ENCOUNTER — Other Ambulatory Visit: Payer: Self-pay

## 2024-01-06 ENCOUNTER — Ambulatory Visit: Payer: Medicare Other

## 2024-01-06 ENCOUNTER — Encounter: Attending: Family Medicine | Admitting: *Deleted

## 2024-01-06 VITALS — BP 137/75 | HR 70 | Temp 98.0°F | Resp 18

## 2024-01-06 DIAGNOSIS — M810 Age-related osteoporosis without current pathological fracture: Secondary | ICD-10-CM | POA: Diagnosis present

## 2024-01-06 MED ORDER — DENOSUMAB 60 MG/ML ~~LOC~~ SOSY
60.0000 mg | PREFILLED_SYRINGE | Freq: Once | SUBCUTANEOUS | Status: AC
  Administered 2024-01-06: 60 mg via SUBCUTANEOUS

## 2024-01-06 NOTE — Progress Notes (Signed)
 Diagnosis: Osteoporosis  Provider:  Dwana Melena MD  Procedure: Injection  Prolia (Denosumab), Dose: 60 mg, Site: subcutaneous, Number of injections: 1  Injection Site(s): Right arm  Post Care: Observation period completed  Discharge: Condition: Good, Destination: Home . AVS Provided  Performed by:  Daleen Squibb, RN

## 2024-07-08 ENCOUNTER — Ambulatory Visit

## 2024-07-08 VITALS — BP 136/80 | HR 77 | Temp 98.3°F

## 2024-07-08 DIAGNOSIS — M810 Age-related osteoporosis without current pathological fracture: Secondary | ICD-10-CM | POA: Insufficient documentation

## 2024-07-08 MED ADMIN — Denosumab Inj Soln Prefilled Syringe 60 MG/ML: 60 mg | SUBCUTANEOUS | NDC 55513071021

## 2024-07-08 NOTE — Progress Notes (Signed)
 Diagnosis: Osteoporosis  Provider:  Hall, Zack MD  Procedure: Injection  Prolia  (Denosumab ), Dose: 60 mg, Site: subcutaneous, Number of injections: 1  Injection Site(s): Right arm  Post Care: Observation period completed  Discharge: Condition: Good, Destination: Home . AVS Declined  Performed by:  Baldwin Darice Helling, RN
